# Patient Record
Sex: Male | Born: 1985 | Race: Black or African American | Hispanic: No | Marital: Single | Smoking: Never smoker
Health system: Southern US, Community
[De-identification: ages and names within clinical notes are randomized; demographics above are authoritative.]

---

## 2016-04-21 ENCOUNTER — Emergency Department
Admission: EM | Admit: 2016-04-21 | Discharge: 2016-04-21 | Disposition: A | Discharge status: Home / Self Care | Payer: Self-pay | Attending: Student in an Organized Health Care Education/Training Program | Admitting: Student in an Organized Health Care Education/Training Program

## 2016-04-21 ENCOUNTER — Encounter: Payer: Self-pay | Admitting: Emergency Medicine

## 2016-04-21 DIAGNOSIS — R110 Nausea: Secondary | ICD-10-CM | POA: Insufficient documentation

## 2016-04-21 DIAGNOSIS — R6883 Chills (without fever): Secondary | ICD-10-CM | POA: Insufficient documentation

## 2016-04-21 DIAGNOSIS — R05 Cough: Secondary | ICD-10-CM | POA: Insufficient documentation

## 2016-04-21 DIAGNOSIS — J111 Influenza due to unidentified influenza virus with other respiratory manifestations: Secondary | ICD-10-CM

## 2016-04-21 DIAGNOSIS — R531 Weakness: Secondary | ICD-10-CM | POA: Insufficient documentation

## 2016-04-21 DIAGNOSIS — R5383 Other fatigue: Secondary | ICD-10-CM | POA: Insufficient documentation

## 2016-04-21 DIAGNOSIS — R69 Illness, unspecified: Secondary | ICD-10-CM

## 2016-04-21 DIAGNOSIS — R0981 Nasal congestion: Secondary | ICD-10-CM | POA: Insufficient documentation

## 2016-04-21 MED ORDER — FLUTICASONE PROPIONATE 50 MCG/ACT NA SUSP: 2.0000 | Freq: Every day | NASAL | 0 refills | Status: AC

## 2016-04-21 MED ORDER — BENZONATATE 100 MG PO CAPS: 100.0000 mg | ORAL_CAPSULE | Freq: Three times a day (TID) | ORAL | 0 refills | Status: AC | PRN

## 2016-04-21 MED ORDER — ONDANSETRON HCL 4 MG PO TABS: 4.0000 mg | ORAL_TABLET | Freq: Every day | ORAL | 0 refills | Status: AC | PRN

## 2016-04-21 MED ORDER — ONDANSETRON HCL 4 MG PO TABS
4.0000 mg | ORAL_TABLET | Freq: Once | ORAL | Status: AC
  Administered 2016-04-21: 4 mg via ORAL
  Filled 2016-04-21: qty 1

## 2016-04-21 NOTE — ED Triage Notes (Signed)
Pt reports feeling fatigued last night at work and left and went home. Pt reports generalized body aches today, reports took sinus medication with no improvement.

## 2016-04-21 NOTE — ED Notes (Signed)
See triage note   States he felt fatigued yesterday with some body aches ..denies any fever but did have chills   Afebrile on arrival to ED .Marland Kitchen Also states he has had some sinus pressure

## 2016-04-21 NOTE — ED Provider Notes (Signed)
[facility] Emergency Department Provider Note  ____________________________________________  Time seen: Approximately 11:29 AM  I have reviewed the triage vital signs and the nursing notes.   HISTORY  Chief Complaint Generalized Body Aches    HPI Vincent Shah is a 31 y.o. male that presents to emergency department with fatigue, generalized body aches, chills, weakness, nasal congestion, nonproductive cough, and nausea for 1 day. Patient is eating and drinking less then usual. Patient left work early last night because he was feeling fatigued and his muscles were hurting. Patient denies fever, sore throat, shortness of breath, chest pain, vomiting, abdominal pain.   History reviewed. No pertinent past medical history.  There are no active problems to display for this patient.   No past surgical history on file.  Prior to Admission medications   Medication Sig Start Date End Date Taking? Authorizing Provider  benzonatate (TESSALON PERLES) 100 MG capsule Take 1 capsule (100 mg total) by mouth 3 (three) times daily as needed for cough. 04/21/16 04/21/17  Enid Derry, PA-C  fluticasone (FLONASE) 50 MCG/ACT nasal spray Place 2 sprays into both nostrils daily. 04/21/16 04/21/17  Enid Derry, PA-C  ondansetron (ZOFRAN) 4 MG tablet Take 1 tablet (4 mg total) by mouth daily as needed for nausea or vomiting. 04/21/16 04/21/17  Enid Derry, PA-C    Allergies Patient has no known allergies.  No family history on file.  Social History Social History  Substance Use Topics  . Smoking status: Not on file  . Smokeless tobacco: Not on file  . Alcohol use Not on file     Review of Systems  Constitutional: Positive for chills Eyes: No visual changes. No discharge. ENT: Positive for congestion and rhinorrhea. Cardiovascular: No chest pain. Respiratory: Positive for cough. No SOB. Gastrointestinal: No abdominal pain.  Positive for nausea. No vomiting.  No diarrhea.  No  constipation. Musculoskeletal: Positive for musculoskeletal pain. Skin: Negative for rash, abrasions, lacerations, ecchymosis. Neurological: Negative for headaches.   ____________________________________________   PHYSICAL EXAM:  VITAL SIGNS: ED Triage Vitals  Enc Vitals Group     BP 04/21/16 1008 116/75     Pulse Rate 04/21/16 1008 73     Resp 04/21/16 1008 18     Temp 04/21/16 1008 98.1 F (36.7 C)     Temp Source 04/21/16 1008 Oral     SpO2 04/21/16 1008 100 %     Weight 04/21/16 1008 167 lb (75.8 kg)     Height 04/21/16 1008  (1.778 m)     Head Circumference --      Peak Flow --      Pain Score 04/21/16 1013 0     Pain Loc --      Pain Edu? --      Excl. in GC? --      Constitutional: Alert and oriented. Well appearing and in no acute distress. Eyes: Conjunctivae are normal. PERRL. EOMI. No discharge. Head: Atraumatic. ENT: No frontal and maxillary sinus tenderness.      Ears: Tympanic membranes pearly gray with good landmarks. No discharge.      Nose: Mild congestion/rhinnorhea.      Mouth/Throat: Mucous membranes are moist. Oropharynx non-erythematous. Tonsils not enlarged. No exudates. Uvula midline. Neck: No stridor.   Hematological/Lymphatic/Immunilogical: No cervical lymphadenopathy. Cardiovascular: Normal rate, regular rhythm.  Good peripheral circulation. Respiratory: Normal respiratory effort without tachypnea or retractions. Lungs CTAB. Good air entry to the bases with no decreased or absent breath sounds. Gastrointestinal: Bowel sounds 4  quadrants. Soft and nontender to palpation. No guarding or rigidity. No palpable masses. No distention. Musculoskeletal: Full range of motion to all extremities. No gross deformities appreciated. Neurologic:  Normal speech and language. No gross focal neurologic deficits are appreciated.  Skin:  Skin is warm, dry and intact. No rash noted.   ____________________________________________   LABS (all labs ordered  are listed, but only abnormal results are displayed)  Labs Reviewed - No data to display ____________________________________________  EKG   ____________________________________________  RADIOLOGY   No results found.  ____________________________________________    PROCEDURES  Procedure(s) performed:    Procedures    Medications  ondansetron (ZOFRAN) tablet 4 mg (4 mg Oral Given 04/21/16 1135)     ____________________________________________   INITIAL IMPRESSION / ASSESSMENT AND PLAN / ED COURSE  Pertinent labs & imaging results that were available during my care of the patient were reviewed by me and considered in my medical decision making (see chart for details).  Review of the [place] CSRS was performed in accordance of the [facility] prior to dispensing any controlled drugs.     Patient's diagnosis is consistent with influenza-like illness. Vital signs and exam are reassuring. Patient appears well and is staying well hydrated. Patient received a dose of Zofran in ED and was able to drink water and eat graham crackers without difficulty afterward. Patient should alternate tylenol and ibuprofen for fever and muscle aches. Patient feels comfortable going home. Patient will be discharged home with prescriptions for Zofran, Tessalon Perles, Flonase. Work note was provided. Patient is to follow up with PCP as needed or otherwise directed. Patient is given ED precautions to return to the ED for any worsening or new symptoms.     ____________________________________________  FINAL CLINICAL IMPRESSION(S) / ED DIAGNOSES  Final diagnoses:  Influenza-like illness      NEW MEDICATIONS STARTED DURING THIS VISIT:  Discharge Medication List as of 04/21/2016 12:16 PM    START taking these medications   Details  benzonatate (TESSALON PERLES) 100 MG capsule Take 1 capsule (100 mg total) by mouth 3 (three) times daily as needed for cough., Starting Mon 04/21/2016, Until Tue  04/21/2017, Print    fluticasone (FLONASE) 50 MCG/ACT nasal spray Place 2 sprays into both nostrils daily., Starting Mon 04/21/2016, Until Tue 04/21/2017, Print    ondansetron (ZOFRAN) 4 MG tablet Take 1 tablet (4 mg total) by mouth daily as needed for nausea or vomiting., Starting Mon 04/21/2016, Until Tue 04/21/2017, Print            This chart was dictated using voice recognition software/Dragon. Despite best efforts to proofread, errors can occur which can change the meaning. Any change was purely unintentional.    Enid Derry, PA-C 04/21/16 1604    Willy Eddy, MD 04/22/16 [phone]

## 2017-09-23 ENCOUNTER — Emergency Department (HOSPITAL_COMMUNITY)
Admission: EM | Admit: 2017-09-23 | Discharge: 2017-09-23 | Disposition: A | Discharge status: Home / Self Care | Payer: BLUE CROSS/BLUE SHIELD | Attending: Emergency Medicine | Admitting: Emergency Medicine

## 2017-09-23 ENCOUNTER — Emergency Department (HOSPITAL_COMMUNITY): Payer: BLUE CROSS/BLUE SHIELD

## 2017-09-23 ENCOUNTER — Encounter (HOSPITAL_COMMUNITY): Payer: Self-pay | Admitting: Emergency Medicine

## 2017-09-23 DIAGNOSIS — M542 Cervicalgia: Secondary | ICD-10-CM

## 2017-09-23 DIAGNOSIS — M25512 Pain in left shoulder: Secondary | ICD-10-CM | POA: Insufficient documentation

## 2017-09-23 DIAGNOSIS — Z79899 Other long term (current) drug therapy: Secondary | ICD-10-CM | POA: Insufficient documentation

## 2017-09-23 MED ORDER — IBUPROFEN 600 MG PO TABS: 600.0000 mg | ORAL_TABLET | Freq: Four times a day (QID) | ORAL | 0 refills | Status: AC | PRN

## 2017-09-23 MED ORDER — IBUPROFEN 400 MG PO TABS
600.0000 mg | ORAL_TABLET | Freq: Once | ORAL | Status: AC
  Administered 2017-09-23: 600 mg via ORAL
  Filled 2017-09-23: qty 1

## 2017-09-23 MED ORDER — METHOCARBAMOL 500 MG PO TABS: 500.0000 mg | ORAL_TABLET | Freq: Two times a day (BID) | ORAL | 0 refills | Status: AC

## 2017-09-23 NOTE — Discharge Instructions (Signed)
The pain your experiencing is likely due to muscle strain, you may take Ibuprofen and Robaxin as needed for pain management. Do not combine with any pain reliever other than tylenol. The muscle soreness should improve over the next week. Follow up with your family doctor in the next week for a recheck if you are still having symptoms. Return to ED if pain is worsening, you develop weakness or numbness of extremities, or new or concerning symptoms develop. °
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
## 2018-01-25 ENCOUNTER — Emergency Department (HOSPITAL_COMMUNITY)
Admission: EM | Admit: 2018-01-25 | Discharge: 2018-01-25 | Disposition: A | Discharge status: Home / Self Care | Payer: BLUE CROSS/BLUE SHIELD | Attending: Emergency Medicine | Admitting: Emergency Medicine

## 2018-01-25 ENCOUNTER — Encounter (HOSPITAL_COMMUNITY): Payer: Self-pay | Admitting: *Deleted

## 2018-01-25 DIAGNOSIS — Y939 Activity, unspecified: Secondary | ICD-10-CM | POA: Insufficient documentation

## 2018-01-25 DIAGNOSIS — Y92009 Unspecified place in unspecified non-institutional (private) residence as the place of occurrence of the external cause: Secondary | ICD-10-CM | POA: Insufficient documentation

## 2018-01-25 DIAGNOSIS — R51 Headache: Secondary | ICD-10-CM | POA: Insufficient documentation

## 2018-01-25 DIAGNOSIS — Y999 Unspecified external cause status: Secondary | ICD-10-CM | POA: Diagnosis not present

## 2018-01-25 DIAGNOSIS — S0990XA Unspecified injury of head, initial encounter: Secondary | ICD-10-CM | POA: Insufficient documentation

## 2018-01-25 DIAGNOSIS — W1830XA Fall on same level, unspecified, initial encounter: Secondary | ICD-10-CM | POA: Insufficient documentation

## 2018-01-25 DIAGNOSIS — R55 Syncope and collapse: Secondary | ICD-10-CM | POA: Diagnosis not present

## 2018-01-25 MED ORDER — METOCLOPRAMIDE HCL 10 MG PO TABS
5.0000 mg | ORAL_TABLET | Freq: Once | ORAL | Status: AC
  Administered 2018-01-25: 5 mg via ORAL
  Filled 2018-01-25: qty 1

## 2018-01-25 MED ORDER — KETOROLAC TROMETHAMINE 30 MG/ML IJ SOLN
30.0000 mg | Freq: Once | INTRAMUSCULAR | Status: AC
  Administered 2018-01-25: 30 mg via INTRAMUSCULAR
  Filled 2018-01-25: qty 1

## 2018-01-25 NOTE — Discharge Instructions (Addendum)
Please read attached information. If you experience any new or worsening signs or symptoms please return to the emergency room for evaluation. Please follow-up with your primary care provider or specialist as discussed.  °

## 2018-01-25 NOTE — ED Provider Notes (Signed)
[facility] EMERGENCY DEPARTMENT Provider Note   CSN: [id number] Arrival date & time: 01/25/18  1001     History   Chief Complaint Chief Complaint  Patient presents with  . Head Injury    HPI Vincent Shah is a 33 y.o. male.  HPI   33 year old male presents today with complaints of headache.  Patient notes that last night he was laying in bed, he jumped out of bed and ran down the stairs, he notes that by the time he reached the bottom step he felt like he was going to pass out.  He notes passing out onto a hardwood floor.  He denies any trauma to his head neck or back, he notes waking up this morning with a generalized frontal headache with light sensitivity.  Patient denies any neurological deficits, denies any nausea or vomiting.  Patient reports his heart beating faster as he ran down the stairs, but denied any associated chest pain or shortness of breath.  History reviewed. No pertinent past medical history.  There are no active problems to display for this patient.   History reviewed. No pertinent surgical history.      Home Medications    Prior to Admission medications   Medication Sig Start Date End Date Taking? Authorizing Provider  fluticasone (FLONASE) 50 MCG/ACT nasal spray Place 2 sprays into both nostrils daily. 04/21/16 04/21/17  Enid Derry, PA-C  ibuprofen (ADVIL,MOTRIN) 600 MG tablet Take 1 tablet (600 mg total) by mouth every 6 (six) hours as needed. 09/23/17   Dartha Lodge, PA-C  methocarbamol (ROBAXIN) 500 MG tablet Take 1 tablet (500 mg total) by mouth 2 (two) times daily. 09/23/17   Dartha Lodge, PA-C    Family History History reviewed. No pertinent family history.  Social History Social History   Tobacco Use  . Smoking status: Never Smoker  . Smokeless tobacco: Never Used  Substance Use Topics  . Alcohol use: Yes    Comment: occ  . Drug use: Not Currently    Comment: CBD oil     Allergies   Peanut-containing drug  products   Review of Systems Review of Systems  All other systems reviewed and are negative.    Physical Exam Updated Vital Signs BP 112/69   Pulse 90   Resp 14   SpO2 98%   Physical Exam Vitals signs and nursing note reviewed.  Constitutional:      Appearance: He is well-developed.  HENT:     Head: Normocephalic and atraumatic.  Eyes:     General: No scleral icterus.       Right eye: No discharge.        Left eye: No discharge.     Conjunctiva/sclera: Conjunctivae normal.     Pupils: Pupils are equal, round, and reactive to light.  Neck:     Musculoskeletal: Normal range of motion.     Vascular: No JVD.     Trachea: No tracheal deviation.  Pulmonary:     Effort: Pulmonary effort is normal.     Breath sounds: No stridor.  Neurological:     Mental Status: He is alert and oriented to person, place, and time.     Coordination: Coordination normal.  Psychiatric:        Behavior: Behavior normal.        Thought Content: Thought content normal.        Judgment: Judgment normal.     ED Treatments / Results  Labs (all labs ordered  are listed, but only abnormal results are displayed) Labs Reviewed - No data to display  EKG None  Radiology No results found.  Procedures Procedures (including critical care time)  Medications Ordered in ED Medications  ketorolac (TORADOL) 30 MG/ML injection 30 mg (30 mg Intramuscular Given 01/25/18 1129)  metoCLOPramide (REGLAN) tablet 5 mg (5 mg Oral Given 01/25/18 1129)     Initial Impression / Assessment and Plan / ED Course  I have reviewed the triage vital signs and the nursing notes.  Pertinent labs & imaging results that were available during my care of the patient were reviewed by me and considered in my medical decision making (see chart for details).     Labs:   Imaging:  Consults:  Therapeutics: Toradol, Reglan  Discharge Meds:   Assessment/Plan: 33 year old male presents today with minor headache.  Patient  notes that he passed out last night.  He notes this was after jumping out of bed and running down the stairs, have low suspicion for any acute cardiac or pulmonary etiology.  Patient asymptomatic today other than the headache.  His headache improved with Toradol here with no associated neurological deficits.  I discussed imaging of the head with the patient, he agrees that at this point he would like to defer imaging, have very low suspicion for any acute intercranial abnormality.  Patient discharged with symptomatic care and strict return precautions.  He verbalized understanding and agreement to today's plan.  Final Clinical Impressions(s) / ED Diagnoses   Final diagnoses:  Injury of head, initial encounter    ED Discharge Orders    None       Rosalio Loud 01/25/18 1453    Azalia Bilis, MD 01/26/18 [id number]

## 2018-01-25 NOTE — ED Triage Notes (Signed)
Pt in after a fall last night, states he was walking down the steps and fell on the last step, reports hitting his head during fall and c/o headache at this time, unsure of LOC but pt thinks so, denies neck pain, no distress noted
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
## 2020-09-29 ENCOUNTER — Other Ambulatory Visit: Payer: Self-pay

## 2020-09-29 ENCOUNTER — Ambulatory Visit (INDEPENDENT_AMBULATORY_CARE_PROVIDER_SITE_OTHER): Payer: Self-pay

## 2020-09-29 ENCOUNTER — Ambulatory Visit
Admission: EM | Admit: 2020-09-29 | Discharge: 2020-09-29 | Disposition: A | Discharge status: Home / Self Care | Payer: Self-pay | Attending: Family Medicine | Admitting: Family Medicine

## 2020-09-29 ENCOUNTER — Encounter: Payer: Self-pay | Admitting: Emergency Medicine

## 2020-09-29 DIAGNOSIS — R059 Cough, unspecified: Secondary | ICD-10-CM

## 2020-09-29 DIAGNOSIS — R0602 Shortness of breath: Secondary | ICD-10-CM

## 2020-09-29 MED ORDER — PREDNISONE 20 MG PO TABS: 40.0000 mg | ORAL_TABLET | Freq: Every day | ORAL | 0 refills | Status: AC

## 2020-09-29 MED ORDER — AZITHROMYCIN 250 MG PO TABS: 250.0000 mg | ORAL_TABLET | Freq: Every day | ORAL | 0 refills | Status: AC

## 2020-09-29 NOTE — ED Provider Notes (Signed)
[facility]   [id number] 09/29/20 Arrival Time: 6812  ASSESSMENT & PLAN:  1. Cough   2. SOB (shortness of breath)    I have personally viewed the imaging studies ordered this visit. Ques slight bilateral infiltrate.   Discharge Instructions      I am putting you on an antibiotic for a possible early pneumonia. You may follow up here if not improving over the next week. You may do any activity you can tolerate. This is not contagious.  Begin: Meds ordered this encounter  Medications   predniSONE (DELTASONE) 20 MG tablet    Sig: Take 2 tablets (40 mg total) by mouth daily.    Dispense:  10 tablet    Refill:  0   azithromycin (ZITHROMAX) 250 MG tablet    Sig: Take 1 tablet (250 mg total) by mouth daily. Take first 2 tablets together, then 1 every day until finished.    Dispense:  6 tablet    Refill:  0     Follow-up Information     [facility] at [facility] .   Specialty: Urgent Care Why: If worsening or failing to improve as anticipated. Contact information: [address] [id number] [address] [address] [phone]                Reviewed expectations re: course of current medical issues. Questions answered. Outlined signs and symptoms indicating need for more acute intervention. Understanding verbalized. After Visit Summary given.   SUBJECTIVE: History from: patient. Vincent Shah is a 35 y.o. male who reports: prod coughing, runny nose; past 1-2 w. SOB at times without CP. Denies: fever. Normal PO intake without n/v/d.  Social History   Tobacco Use  Smoking Status Never  Smokeless Tobacco Never     OBJECTIVE:  Vitals:   09/29/20 0850  BP: 124/71  Pulse: 67  Temp: 98 F (36.7 C)  TempSrc: Oral  SpO2: 97%  Weight: 83.9 kg  Height: 5\' 10"  (1.778 m)    General appearance: alert; no distress Eyes: PERRLA; EOMI; conjunctiva normal HENT: [place]; AT; with mild nasal congestion Neck:  supple  Lungs: speaks full sentences without difficulty; unlabored Extremities: no edema Skin: warm and dry Neurologic: normal gait Psychological: alert and cooperative; normal mood and affect   Imaging: DG Chest 2 View  Result Date: 09/29/2020 CLINICAL DATA:  Shortness of breath.  Cough.  Runny nose. EXAM: CHEST - 2 VIEW COMPARISON:  None. FINDINGS: Midline trachea. Normal heart size and mediastinal contours. No pleural effusion or pneumothorax. Moderate pulmonary interstitial thickening is felt to be slightly asymmetric, greater on the right. No lobar consolidation. IMPRESSION: Moderate pulmonary interstitial thickening, slightly greater on the right. Although this could be technique related, in this nonsmoker, is suspicious for viral or mycoplasma pneumonia. Sequelae of asthma or chronic bronchitis could look similar. Electronically Signed   By: 11/29/2020 M.D.   On: 09/29/2020 09:28    Allergies  Allergen Reactions   Peanut-Containing Drug Products     History reviewed. No pertinent past medical history. Social History   Socioeconomic History   Marital status: Single    Spouse name: Not on file   Number of children: Not on file   Years of education: Not on file   Highest education level: Not on file  Occupational History   Not on file  Tobacco Use   Smoking status: Never   Smokeless tobacco: Never  Vaping Use   Vaping Use: Every day  Substance and Sexual Activity   Alcohol use: Yes    Comment: occ   Drug use: Not Currently    Comment: CBD oil   Sexual activity: Not on file  Other Topics Concern   Not on file  Social History Narrative   Not on file   Social Determinants of Health   Financial Resource Strain: Not on file  Food Insecurity: Not on file  Transportation Needs: Not on file  Physical Activity: Not on file  Stress: Not on file  Social Connections: Not on file  Intimate Partner Violence: Not on file   No family history on file. History reviewed.  No pertinent surgical history.   Mardella Layman, MD 10/01/20 [phone]

## 2020-09-29 NOTE — Discharge Instructions (Addendum)
I am putting you on an antibiotic for a possible early pneumonia. You may follow up here if not improving over the next week. You may do any activity you can tolerate. This is not contagious.

## 2020-09-29 NOTE — ED Triage Notes (Signed)
Patient c/o head congestion, productive cough, runny nose, SOB x 2 days.  Patient has been taken Dayquil and Nyquil.  Patient is vaccinated for COVID.
